# Patient Record
Sex: Female | Born: 1955 | Race: White | Hispanic: No | Marital: Married | Smoking: Current every day smoker
Health system: Southern US, Community
[De-identification: ages and names within clinical notes are randomized; demographics above are authoritative.]

## PROBLEM LIST (undated history)

## (undated) DIAGNOSIS — H6690 Otitis media, unspecified, unspecified ear: Secondary | ICD-10-CM

## (undated) DIAGNOSIS — J0100 Acute maxillary sinusitis, unspecified: Secondary | ICD-10-CM

## (undated) DIAGNOSIS — R1310 Dysphagia, unspecified: Secondary | ICD-10-CM

## (undated) DIAGNOSIS — E2839 Other primary ovarian failure: Secondary | ICD-10-CM

## (undated) DIAGNOSIS — E559 Vitamin D deficiency, unspecified: Secondary | ICD-10-CM

## (undated) DIAGNOSIS — H9209 Otalgia, unspecified ear: Secondary | ICD-10-CM

## (undated) DIAGNOSIS — M549 Dorsalgia, unspecified: Secondary | ICD-10-CM

## (undated) DIAGNOSIS — R300 Dysuria: Secondary | ICD-10-CM

## (undated) DIAGNOSIS — M25512 Pain in left shoulder: Secondary | ICD-10-CM

## (undated) DIAGNOSIS — F419 Anxiety disorder, unspecified: Secondary | ICD-10-CM

## (undated) DIAGNOSIS — R413 Other amnesia: Secondary | ICD-10-CM

## (undated) DIAGNOSIS — M7731 Calcaneal spur, right foot: Secondary | ICD-10-CM

## (undated) DIAGNOSIS — E7800 Pure hypercholesterolemia, unspecified: Secondary | ICD-10-CM

## (undated) DIAGNOSIS — J40 Bronchitis, not specified as acute or chronic: Secondary | ICD-10-CM

## (undated) HISTORY — DX: Anxiety disorder, unspecified: F41.9

## (undated) HISTORY — DX: Dysphagia, unspecified: R13.10

## (undated) HISTORY — DX: Calcaneal spur, right foot: M77.31

## (undated) HISTORY — DX: Bronchitis, not specified as acute or chronic: J40

## (undated) HISTORY — DX: Otitis media, unspecified, unspecified ear: H66.90

## (undated) HISTORY — DX: Otalgia, unspecified ear: H92.09

## (undated) HISTORY — DX: Acute maxillary sinusitis, unspecified: J01.00

## (undated) HISTORY — DX: Other primary ovarian failure: E28.39

## (undated) HISTORY — DX: Vitamin D deficiency, unspecified: E55.9

## (undated) HISTORY — DX: Dysuria: R30.0

## (undated) HISTORY — DX: Other amnesia: R41.3

## (undated) HISTORY — DX: Pure hypercholesterolemia, unspecified: E78.00

## (undated) HISTORY — PX: APPENDECTOMY: SHX54

## (undated) HISTORY — DX: Dorsalgia, unspecified: M54.9

## (undated) HISTORY — DX: Pain in left shoulder: M25.512

---

## 2019-07-15 ENCOUNTER — Ambulatory Visit: Payer: BC Managed Care – PPO | Admitting: Neurology

## 2019-07-15 ENCOUNTER — Other Ambulatory Visit: Payer: Self-pay

## 2019-07-15 ENCOUNTER — Encounter: Payer: Self-pay | Admitting: Neurology

## 2019-07-15 VITALS — BP 119/79 | HR 83 | Temp 98.7°F | Ht 66.0 in | Wt 106.0 lb

## 2019-07-15 DIAGNOSIS — R413 Other amnesia: Secondary | ICD-10-CM

## 2019-07-15 DIAGNOSIS — E538 Deficiency of other specified B group vitamins: Secondary | ICD-10-CM | POA: Diagnosis not present

## 2019-07-15 DIAGNOSIS — R9089 Other abnormal findings on diagnostic imaging of central nervous system: Secondary | ICD-10-CM | POA: Diagnosis not present

## 2019-07-15 NOTE — Progress Notes (Signed)
Reason for visit: Weight loss, abnormal MRI brain  Referring physician: Dr. Dorna Klein is a 63 y.o. female  History of present illness:  Sabrina Klein is a 63 year old right-handed white female with a history of relatively sudden onset of problems with weight loss and some cognitive changes.  The symptoms began in March 2020, the patient has lost from 158 pounds to 106, she has had a decline in her appetite, she gets full with eating small bites.  The patient has had some occasional headaches that have also developed but these are minor headaches and occur about once a week.  Prior to 3 months ago she did not have headaches.  The headaches are bifrontal in nature, unassociated with nausea vomiting or photophobia or phonophobia.  The headaches may last about 2 hours and seem to improve with Tylenol.  She has had severe fatigue with the onset of the weight loss, she is sleeping fairly well but feels "weak all over".  She has had occasional episodes of left leg numbness.  She has had a change in her balance.  She has had 1 fall 2 weeks ago.  She denies issues controlling the bowels or the bladder.  She has had some change in memory, her short-term memory has changed, she has had difficulty keeping up with dates, she is having difficulty with directions.  She has had some problems keeping up with medications and appointments and with doing the finances.  She has developed a flat affect, it was felt that she had depression and she was placed on antidepressant medications without benefit.  She has not had any blackout episodes or seizure type events.  MRI of the brain was done and showed bilateral mesial temporal hyperintensities that by contrast study shows mild enhancement.  The possibility of limbic encephalitis was entertained.  The patient is a smoker, she smokes a pack of cigarettes daily, she has not had a recent chest x-ray or CT of the chest.  CT of the abdomen and pelvis showed a  possible mass or polyp in the right colon.  This will be evaluated in the near future.  No past medical history on file.  Past Surgical History:  Procedure Laterality Date   APPENDECTOMY      Family History  Problem Relation Age of Onset   High blood pressure Mother    Diabetes Mother    Stroke Mother     Social history:  reports that she has been smoking cigarettes. She started smoking about 80 years ago. She has never used smokeless tobacco. She reports that she does not drink alcohol or use drugs.  Medications:  Prior to Admission medications   Medication Sig Start Date End Date Taking? Authorizing Provider  clonazePAM (KLONOPIN) 0.5 MG tablet Take 0.5 mg by mouth daily as needed. 06/27/19  Yes [provider]  potassium citrate (UROCIT-K) 10 MEQ (1080 MG) SR tablet TAKE 1 TABLET BY MOUTH EVERY DAY FOR 14 DAYS 07/08/19  Yes [provider]  VIIBRYD 20 MG TABS Take 1 tablet by mouth daily. 06/26/19  Yes [provider]     No Known Allergies  ROS:  Out of a complete 14 system review of symptoms, the patient complains only of the following symptoms, and all other reviewed systems are negative.  Memory decline, fatigue Weight loss  Blood pressure 119/79, pulse 83, temperature 98.7 F (37.1 C), temperature source Temporal, height 5\' 6"  (1.676 m), weight 106 lb (48.1 kg).  Physical  Exam  General: The patient is alert and cooperative at the time of the examination.  The patient has a flat affect.  Eyes: Pupils are equal, round, and reactive to light. Discs are flat bilaterally.  Neck: The neck is supple, no carotid bruits are noted.  Respiratory: The respiratory examination is clear.  Cardiovascular: The cardiovascular examination reveals a regular rate and rhythm, no obvious murmurs or rubs are noted.  Skin: Extremities are without significant edema.  Neurologic Exam  Mental status: The patient is alert and oriented x 3 at the time of  the examination. The patient has a flat affect, she is able to answer questions appropriately.  Mini-Mental status examination done today shows a total score 28/30.  Cranial nerves: Facial symmetry is not present.  Mild ptosis on the left is noted.  There is good sensation of the face to pinprick and soft touch bilaterally. The strength of the facial muscles and the muscles to head turning and shoulder shrug are normal bilaterally. Speech is well enunciated, no aphasia or dysarthria is noted. Extraocular movements are full. Visual fields are full. The tongue is midline, and the patient has symmetric elevation of the soft palate. No obvious hearing deficits are noted.  Motor: The motor testing reveals 5 over 5 strength of all 4 extremities. Good symmetric motor tone is noted throughout.  Sensory: Sensory testing is intact to pinprick, soft touch, vibration sensation, and position sense on all 4 extremities. No evidence of extinction is noted.  Coordination: Cerebellar testing reveals good finger-nose-finger and heel-to-shin bilaterally.  Gait and station: Gait is normal. Tandem gait is normal. Romberg is negative. No drift is seen.  Reflexes: Deep tendon reflexes are symmetric and normal bilaterally. Toes are downgoing bilaterally.   Assessment/Plan:  1.  Weight loss  2.  Cognitive changes, memory disorder  3.  Abnormal MRI brain  The patient has bilateral mesial temporal abnormalities with mild enhancement.  The possibility of a limbic encephalitis does need to be considered.  The patient will be sent for blood work today.  We will pursue lumbar puncture.  I believe that a CT scan of the chest should be done to exclude lung cancer as the patient is a smoker.  The patient will follow-up here in 6 weeks.  Jill Alexanders MD 07/15/2019 1:53 PM  [facility] [address] [place], [place] [address]  Phone [phone] Fax [phone]

## 2019-07-17 ENCOUNTER — Telehealth: Payer: Self-pay | Admitting: *Deleted

## 2019-07-17 NOTE — Telephone Encounter (Signed)
Lab results from [facility] placed on Dr Jannifer Franklin' desk for review.

## 2019-07-17 NOTE — Telephone Encounter (Signed)
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

## 2019-07-18 ENCOUNTER — Telehealth: Payer: Self-pay | Admitting: Neurology

## 2019-07-18 DIAGNOSIS — R634 Abnormal weight loss: Secondary | ICD-10-CM

## 2019-07-18 DIAGNOSIS — R05 Cough: Secondary | ICD-10-CM

## 2019-07-18 DIAGNOSIS — Z87891 Personal history of nicotine dependence: Secondary | ICD-10-CM

## 2019-07-18 DIAGNOSIS — R059 Cough, unspecified: Secondary | ICD-10-CM

## 2019-07-18 NOTE — Addendum Note (Signed)
Addended by: Kathrynn Ducking on: 07/18/2019 09:28 AM   Modules accepted: Orders

## 2019-07-18 NOTE — Telephone Encounter (Signed)
I have called and talk with the daughter.  The patient began having diarrhea several days ago, she is getting weak from this, no fevers noted.  They are unable to contact the primary doctor, if they cannot do this, she may have to go to urgent care.  I will go ahead and order the CT of the chest, her BUN/creatinine were normal.

## 2019-07-18 NOTE — Telephone Encounter (Signed)
Pt daughter in law(on [place]) has called for Dr Jannifer Franklin to be aware that they have not been contacted about the CT or [employer].  Daughter in law is asking for a call from Dr Jannifer Franklin to discuss pt having diarrhea and extreme weakness.  Please call

## 2019-07-22 ENCOUNTER — Telehealth: Payer: Self-pay | Admitting: Neurology

## 2019-07-22 NOTE — Telephone Encounter (Signed)
[employer] Auth: NPR via bcbs website order sent to GI. They will reach out to the patient to schedule.

## 2019-07-23 DIAGNOSIS — R634 Abnormal weight loss: Secondary | ICD-10-CM | POA: Insufficient documentation

## 2019-07-23 NOTE — Telephone Encounter (Signed)
I called the daughter.  The blood work has come back normal, 1 antibody test is still pending.  What I would like to do is start her on Solu-Medrol, 1 g daily for 3 days and then once a week for 6 weeks.  This is a treatment/diagnostic procedure for autoimmune dementias.  They appear to be amenable to this.  The patient will be getting her CT of the chest soon, she will have her lumbar puncture done.  She will undergo a colonoscopy in the near future.

## 2019-07-23 NOTE — Telephone Encounter (Signed)
Pt has called asking for the results to blood work, she is asking for a call from Dr Jannifer Franklin.  Pt wants Dr Jannifer Franklin to call her daughter in law Misty [phone]

## 2019-07-24 LAB — ANGIOTENSIN CONVERTING ENZYME: Angio Convert Enzyme: 44 U/L (ref 14–82)

## 2019-07-24 LAB — AMMONIA: Ammonia: 46 ug/dL (ref 34–178)

## 2019-07-24 LAB — PAN-ANCA
ANCA Proteinase 3: <3.5 U/mL (ref 0.0–3.5)
Atypical pANCA: <1:20 {titer}
C-ANCA: <1:20 {titer}
Myeloperoxidase Ab: <9 U/mL (ref 0.0–9.0)
P-ANCA: <1:20 {titer}

## 2019-07-24 LAB — VITAMIN B12: Vitamin B-12: 264 pg/mL (ref 232–1245)

## 2019-07-24 LAB — ANA W/REFLEX: Anti Nuclear Antibody (ANA): NEGATIVE

## 2019-07-24 LAB — PARANEOPLASTIC PROFILE 1
Neuronal Nuc Ab (Ri), IFA: <1:10 {titer}
Neuronal Nuclear (Hu) Antibody (IB): <1:10 {titer}
Purkinje Cell (Yo) Autoantobodies- IFA: <1:10 {titer}

## 2019-07-24 LAB — B. BURGDORFI ANTIBODIES: Lyme IgG/IgM Ab: <0.91 {ISR} (ref 0.00–0.90)

## 2019-07-24 LAB — THYROGLOBULIN ANTIBODY: Thyroglobulin Antibody: <1 IU/mL (ref 0.0–0.9)

## 2019-07-24 LAB — N-METHYL-D-ASPARTATE RECPT.IGG: N-methyl-D-Aspartate Recpt.IgG: <1:10 {titer}

## 2019-07-24 LAB — THYROID PEROXIDASE ANTIBODY: Thyroperoxidase Ab SerPl-aCnc: 23 IU/mL (ref 0–34)

## 2019-07-24 LAB — SEDIMENTATION RATE: Sed Rate: 21 mm/hr (ref 0–40)

## 2019-07-28 ENCOUNTER — Other Ambulatory Visit: Payer: Self-pay

## 2019-07-28 ENCOUNTER — Ambulatory Visit
Admission: RE | Admit: 2019-07-28 | Discharge: 2019-07-28 | Disposition: A | Discharge status: Home / Self Care | Payer: BC Managed Care – PPO | Source: Ambulatory Visit | Attending: Neurology | Admitting: Neurology

## 2019-07-28 ENCOUNTER — Other Ambulatory Visit (HOSPITAL_COMMUNITY)
Admission: RE | Admit: 2019-07-28 | Discharge: 2019-07-28 | Disposition: A | Discharge status: Home / Self Care | Payer: BC Managed Care – PPO | Source: Ambulatory Visit | Attending: Neurology | Admitting: Neurology

## 2019-07-28 VITALS — BP 108/62 | HR 72

## 2019-07-28 DIAGNOSIS — R9089 Other abnormal findings on diagnostic imaging of central nervous system: Secondary | ICD-10-CM | POA: Diagnosis not present

## 2019-07-28 DIAGNOSIS — R413 Other amnesia: Secondary | ICD-10-CM | POA: Diagnosis present

## 2019-07-28 NOTE — Progress Notes (Signed)
One SST tube of blood drawn for LP labs from left [facility] space; site unremarkable.

## 2019-07-28 NOTE — Discharge Instructions (Signed)

## 2019-07-30 ENCOUNTER — Telehealth: Payer: Self-pay | Admitting: *Deleted

## 2019-07-30 ENCOUNTER — Ambulatory Visit
Admission: RE | Admit: 2019-07-30 | Discharge: 2019-07-30 | Disposition: A | Discharge status: Home / Self Care | Payer: BC Managed Care – PPO | Source: Ambulatory Visit | Attending: Neurology | Admitting: Neurology

## 2019-07-30 ENCOUNTER — Telehealth: Payer: Self-pay | Admitting: Neurology

## 2019-07-30 DIAGNOSIS — R933 Abnormal findings on diagnostic imaging of other parts of digestive tract: Secondary | ICD-10-CM

## 2019-07-30 DIAGNOSIS — R634 Abnormal weight loss: Secondary | ICD-10-CM

## 2019-07-30 DIAGNOSIS — R059 Cough, unspecified: Secondary | ICD-10-CM

## 2019-07-30 DIAGNOSIS — R9389 Abnormal findings on diagnostic imaging of other specified body structures: Secondary | ICD-10-CM

## 2019-07-30 DIAGNOSIS — Z87891 Personal history of nicotine dependence: Secondary | ICD-10-CM

## 2019-07-30 DIAGNOSIS — R05 Cough: Secondary | ICD-10-CM

## 2019-07-30 MED ORDER — IOPAMIDOL (ISOVUE-300) INJECTION 61%
75.0000 mL | Freq: Once | INTRAVENOUS | Status: AC | PRN
  Administered 2019-07-30: 75 mL via INTRAVENOUS

## 2019-07-30 NOTE — Telephone Encounter (Signed)
I called Misty back about pts solu medrol schedule at [facility]. I stated pts infusions have already been authorized at [facility] for 3 days next week. I stated if we try to get it change to [facility] it will delay her infusions. I stated we do not want any delays with pts infusions. I recommend to [facility] that pt receive her infusions for the days listed and we can work on getting future infusions at [facility].Misty  verbalized understanding.

## 2019-07-30 NOTE — Telephone Encounter (Signed)
I called Misty on patients dpr about her infusions schedule at [facility].for solu-medrol. She stated pt is schedule for 8/24, 8/25 and 8/26. She stated its a half and hour drive and [facility] is closer. I stated she will receive a call back.

## 2019-07-30 NOTE — Addendum Note (Signed)
Addended by: Marcial Pacas on: 07/30/2019 04:47 PM   Modules accepted: Orders

## 2019-07-30 NOTE — Telephone Encounter (Signed)
Daughter in [place] is asking for a call from RN to discuss pt getting her infusions at [facility], that location is closer for pt.  Please call

## 2019-07-30 NOTE — Telephone Encounter (Signed)
I spoke to her daughter, Stefany Starace (on [place]).  She is aware of the results for the LP and verbalized understanding.

## 2019-07-30 NOTE — Telephone Encounter (Signed)
-----   Message from Penni Bombard, MD sent at 07/29/2019  4:48 PM EDT ----- Normal LP opening pressure. -VRP

## 2019-07-30 NOTE — Telephone Encounter (Addendum)
I have called patient's daughter in law, [place], CT chest showed abnormally enlarged lymph node 3.0 x 1.8 cm, suspicious for malignancy.  I have ordered a PET/CT,  She already has oncologist appt with Dr. Inocente Salles on Monday August 24th, 2020, colonoscopy on August 01, 2019  Will cancel previously scheduled IV steroid infusion  IMPRESSION: 1. There is an abnormally enlarged 3.0 x 1.8 cm AP window lymph node (series 2, image 76, series 4, image 67). There are additional prominent although not pathologically enlarged mediastinal and hilar lymph nodes. Although there is no obvious primary lung mass this finding remains concerning for malignancy. Consider PET-CT to evaluate for abnormal FDG avidity. Comparison to prior CT imaging of the chest, if available, could be helpful to assess for stability.  2.  Emphysema.  3. Occasional tiny pulmonary nodules measuring 3 mm or smaller, nonspecific. Attention on follow-up.  4. Incidental note of a heterogeneous 1.5 cm nodule of the right lobe of the thyroid. Consider dedicated thyroid ultrasound examination to further characterize when clinically appropriate.  5.  Coronary artery disease and aortic atherosclerosis.

## 2019-07-30 NOTE — Telephone Encounter (Signed)
R/c cd from [facility]. Pt cd on Megan.

## 2019-08-04 ENCOUNTER — Telehealth: Payer: Self-pay | Admitting: Neurology

## 2019-08-04 NOTE — Telephone Encounter (Signed)
CT of the chest was done last week when I was not in the office.  This shows findings that are suspicious for malignancy.  The patient apparently has already been referred to oncology.  The steroid injections that were recommended initially have been canceled.  So far, spinal fluid analysis is normal.  Oligoclonal banding is pending, spinal fluid cytology was negative.  A PET scan has been ordered.  I did call the patient, Dr. Krista Blue had already discussed the results with the patient, the patient has an appointment to see oncology today.  I left a message for the patient to call me if she needs anything.   CT chest 07/30/19:  IMPRESSION: 1. There is an abnormally enlarged 3.0 x 1.8 cm AP window lymph node (series 2, image 76, series 4, image 67). There are additional prominent although not pathologically enlarged mediastinal and hilar lymph nodes. Although there is no obvious primary lung mass this finding remains concerning for malignancy. Consider PET-CT to evaluate for abnormal FDG avidity. Comparison to prior CT imaging of the chest, if available, could be helpful to assess for stability.  2.  Emphysema.  3. Occasional tiny pulmonary nodules measuring 3 mm or smaller, nonspecific. Attention on follow-up.  4. Incidental note of a heterogeneous 1.5 cm nodule of the right lobe of the thyroid. Consider dedicated thyroid ultrasound examination to further characterize when clinically appropriate.  5.  Coronary artery disease and aortic atherosclerosis.

## 2019-08-05 ENCOUNTER — Telehealth: Payer: Self-pay | Admitting: Neurology

## 2019-08-05 NOTE — Telephone Encounter (Signed)
Events noted, the patient will certainly need to have a full cancer work-up.

## 2019-08-05 NOTE — Telephone Encounter (Signed)
Pt's daughter in [place], [place] called wanting to inform the provider that the pt saw the oncologist yesterday and they are going to set the pt up for a consultation at [facility] for a bronchoscopy to test for perineum plastic syndrome. They would like to thank the provider for all of the assistance that was provided.

## 2019-08-06 ENCOUNTER — Telehealth: Payer: Self-pay | Admitting: Neurology

## 2019-08-06 LAB — OLIGOCLONAL BANDS, CSF + SERM

## 2019-08-06 LAB — CSF CELL COUNT WITH DIFFERENTIAL
RBC Count, CSF: 9 cells/uL (ref 0–10)
WBC, CSF: 0 cells/uL (ref 0–5)

## 2019-08-06 LAB — ANGIOTENSIN CONVERTING ENZYME, CSF: ACE, CSF: 10 U/L (ref <=15)

## 2019-08-06 LAB — PROTEIN, CSF: Total Protein, CSF: 54 mg/dL (ref 15–60)

## 2019-08-06 LAB — GLUCOSE, CSF: Glucose, CSF: 56 mg/dL (ref 40–80)

## 2019-08-06 NOTE — Telephone Encounter (Signed)
I called the patient.  The spinal fluid results are unremarkable.  Cytology is negative.  There are 5 oligoclonal bands but those bands are also present in the peripheral blood, study does not support the presence of an autoimmune dementia.

## 2019-08-11 ENCOUNTER — Encounter (HOSPITAL_COMMUNITY)
Admission: RE | Admit: 2019-08-11 | Discharge: 2019-08-11 | Disposition: A | Discharge status: Home / Self Care | Payer: BC Managed Care – PPO | Source: Ambulatory Visit | Attending: Neurology | Admitting: Neurology

## 2019-08-11 ENCOUNTER — Telehealth: Payer: Self-pay | Admitting: Neurology

## 2019-08-11 ENCOUNTER — Other Ambulatory Visit: Payer: Self-pay

## 2019-08-11 DIAGNOSIS — R933 Abnormal findings on diagnostic imaging of other parts of digestive tract: Secondary | ICD-10-CM | POA: Diagnosis not present

## 2019-08-11 LAB — GLUCOSE, CAPILLARY: Glucose-Capillary: 88 mg/dL (ref 70–99)

## 2019-08-11 MED ORDER — FLUDEOXYGLUCOSE F - 18 (FDG) INJECTION
5.2000 | Freq: Once | INTRAVENOUS | Status: AC | PRN
  Administered 2019-08-11: 5.2 via INTRAVENOUS

## 2019-08-11 NOTE — Telephone Encounter (Signed)
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  The patient is followed through an oncologist, the patient will be referred to a pulmonologist in the near future at [facility].

## 2019-08-11 NOTE — Telephone Encounter (Signed)
  IMPRESSION: 1. Intense FDG uptake is associated with the left-sided mediastinal lymph node lateral to the left pulmonary artery. Suspicious for primary bronchogenic carcinoma. 2. FDG avid right lobe of thyroid gland nodule measures 1.6 cm. Hypermetabolic thyroid nodules on PET have up to 40-50% incidence of malignancy; recommend further evaluation with thyroid ultrasound and possible US-guided fine needle aspiration. 3. Aortic Atherosclerosis (ICD10-I70.0) and Emphysema (ICD10-J43.9). 4. Coronary artery calcifications.

## 2019-08-14 ENCOUNTER — Telehealth: Payer: Self-pay

## 2019-08-14 NOTE — Telephone Encounter (Signed)
Short term disability for has been completed and signed by Dr. Jannifer Franklin. Form fwd back to medical records for processing.

## 2019-08-28 ENCOUNTER — Ambulatory Visit: Payer: BC Managed Care – PPO | Admitting: Neurology

## 2019-09-26 MED ORDER — OLANZAPINE 10 MG PO TABS
10.00 | ORAL_TABLET | ORAL | Status: DC
Start: ? — End: 2019-09-26

## 2019-09-26 MED ORDER — NALOXONE HCL 0.4 MG/ML IJ SOLN
0.40 | INTRAMUSCULAR | Status: DC
Start: ? — End: 2019-09-26

## 2019-09-26 MED ORDER — OXYCODONE HCL 5 MG PO TABS
5.00 | ORAL_TABLET | ORAL | Status: DC
Start: ? — End: 2019-09-26

## 2019-09-26 MED ORDER — POLYETHYLENE GLYCOL 3350 17 G PO PACK
17.00 | PACK | ORAL | Status: DC
Start: 2019-09-27 — End: 2019-09-26

## 2019-09-26 MED ORDER — NICOTINE 21 MG/24HR TD PT24
1.00 | MEDICATED_PATCH | TRANSDERMAL | Status: DC
Start: 2019-09-27 — End: 2019-09-26

## 2019-09-26 MED ORDER — HEPARIN SODIUM (PORCINE) 5000 UNIT/ML IJ SOLN
5000.00 | INTRAMUSCULAR | Status: DC
Start: 2019-09-26 — End: 2019-09-26

## 2019-09-26 MED ORDER — GENERIC EXTERNAL MEDICATION
1.00 | Status: DC
Start: ? — End: 2019-09-26

## 2019-09-26 MED ORDER — NICOTINE POLACRILEX 4 MG MT LOZG
4.00 | LOZENGE | OROMUCOSAL | Status: DC
Start: ? — End: 2019-09-26

## 2019-09-26 MED ORDER — PNEUMOCOCCAL VAC POLYVALENT 25 MCG/0.5ML IJ INJ
0.50 | INJECTION | INTRAMUSCULAR | Status: DC
Start: ? — End: 2019-09-26

## 2019-09-26 MED ORDER — ONDANSETRON HCL 4 MG/2ML IJ SOLN
4.00 | INTRAMUSCULAR | Status: DC
Start: ? — End: 2019-09-26

## 2019-09-26 MED ORDER — ACETAMINOPHEN 325 MG PO TABS
650.00 | ORAL_TABLET | ORAL | Status: DC
Start: 2019-09-26 — End: 2019-09-26

## 2019-09-26 MED ORDER — INFLUENZA VAC SPLIT QUAD 0.5 ML IM SUSY
0.50 | PREFILLED_SYRINGE | INTRAMUSCULAR | Status: DC
Start: ? — End: 2019-09-26

## 2019-09-26 MED ORDER — DOCUSATE SODIUM 100 MG PO CAPS
100.00 | ORAL_CAPSULE | ORAL | Status: DC
Start: 2019-09-26 — End: 2019-09-26

## 2019-12-17 ENCOUNTER — Telehealth: Payer: Self-pay | Admitting: Neurology

## 2019-12-17 NOTE — Telephone Encounter (Signed)
Called pt and lm on vm that our office will need to collect $50 fee for FMLA forms to be completed

## 2019-12-24 NOTE — Telephone Encounter (Signed)
I called patient again attempting to collect payment for [employer] form. I spoke with patient's daughter who handles patient's affairs and she states that our office does not need to fill the forms out. She states patient's oncologist fills out the paperwork for patient.

## 2020-01-07 ENCOUNTER — Other Ambulatory Visit (HOSPITAL_COMMUNITY): Payer: Self-pay | Admitting: Oncology

## 2020-01-07 DIAGNOSIS — C3490 Malignant neoplasm of unspecified part of unspecified bronchus or lung: Secondary | ICD-10-CM

## 2020-01-12 ENCOUNTER — Encounter (HOSPITAL_COMMUNITY)
Admission: RE | Admit: 2020-01-12 | Discharge: 2020-01-12 | Disposition: A | Discharge status: Home / Self Care | Payer: BC Managed Care – PPO | Source: Ambulatory Visit | Attending: Oncology | Admitting: Oncology

## 2020-01-12 ENCOUNTER — Other Ambulatory Visit: Payer: Self-pay

## 2020-01-12 DIAGNOSIS — C3490 Malignant neoplasm of unspecified part of unspecified bronchus or lung: Secondary | ICD-10-CM | POA: Diagnosis not present

## 2020-01-12 MED ORDER — FLUDEOXYGLUCOSE F - 18 (FDG) INJECTION
6.8600 | Freq: Once | INTRAVENOUS | Status: AC | PRN
  Administered 2020-01-12: 6.86 via INTRAVENOUS

## 2020-01-27 ENCOUNTER — Telehealth: Payer: Self-pay | Admitting: Neurology

## 2020-01-27 NOTE — Telephone Encounter (Signed)
Called pt to collect fee for disability form to be completed and per her Daughter-Inlaw Rojelio Brenner they do not need our office to complete the form pt's oncology doctor is handling all the required forms/bws

## 2020-08-09 IMAGING — PT NM PET TUM IMG RESTAG (PS) SKULL BASE T - THIGH
3 series · 21 of 25 positions shown · non-contrast
Comparison: 08/11/2019

CLINICAL DATA: Subsequent treatment strategy for lung cancer
diagnosed in 8282. Restaging. Radiation therapy finishing 2 weeks
ago. Chemotherapy last week.

EXAM:
NUCLEAR MEDICINE PET SKULL BASE TO THIGH
TECHNIQUE: 6.9 mCi F-18 FDG was injected intravenously. Full-ring PET imaging
was performed from the skull base to thigh after the radiotracer. CT
data was obtained and used for attenuation correction and anatomic
localization.
Fasting blood glucose: 143 mg/dl

[axial ct wb fusion · 14 of 158 slices shown]
[im 1/158]
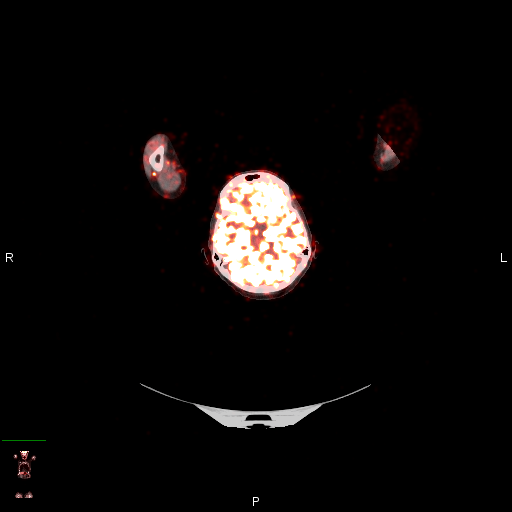
[im 10/158]
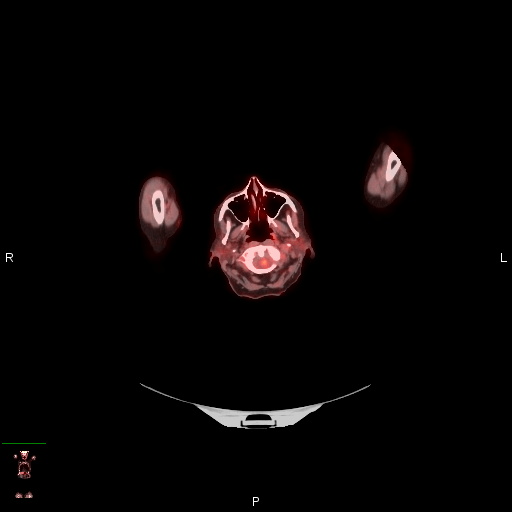
[im 20/158]
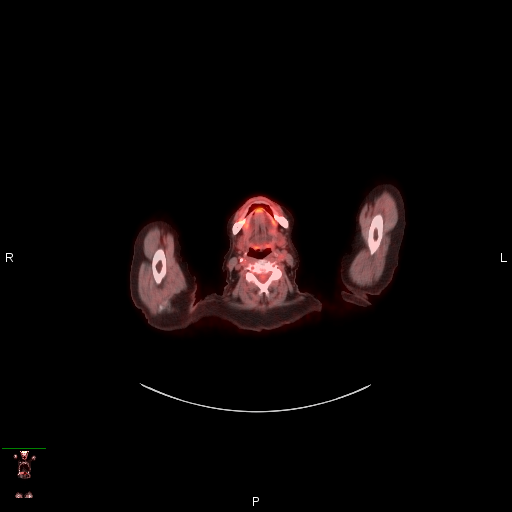
[im 40/158]
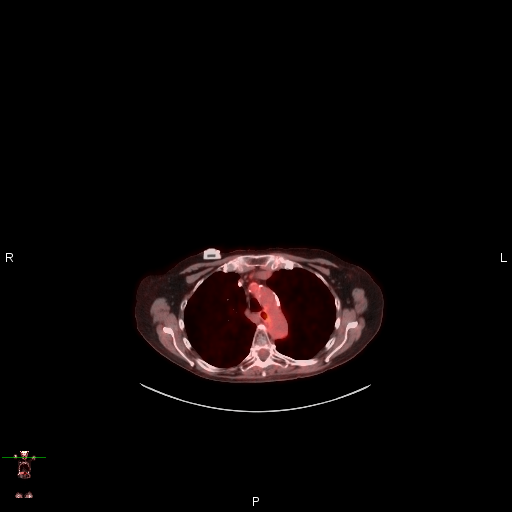
[im 50/158]
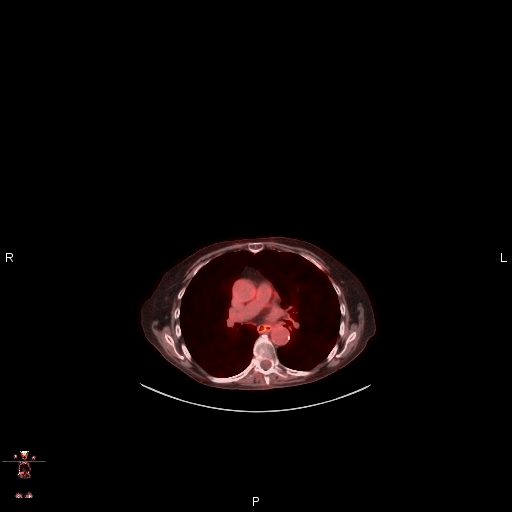
[im 59/158]
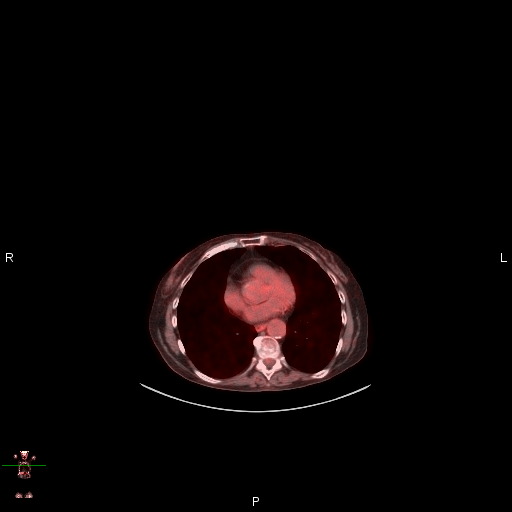
[im 69/158]
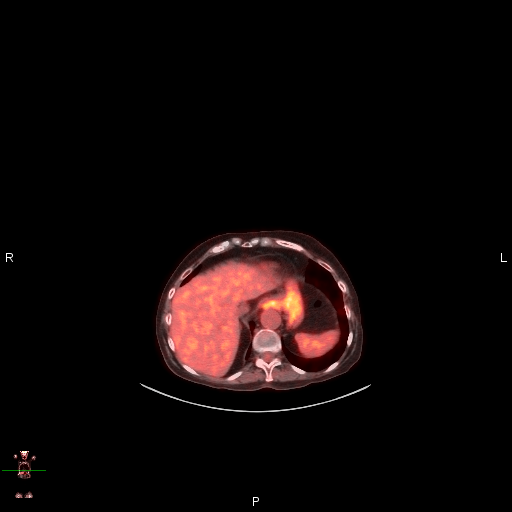
[im 79/158]
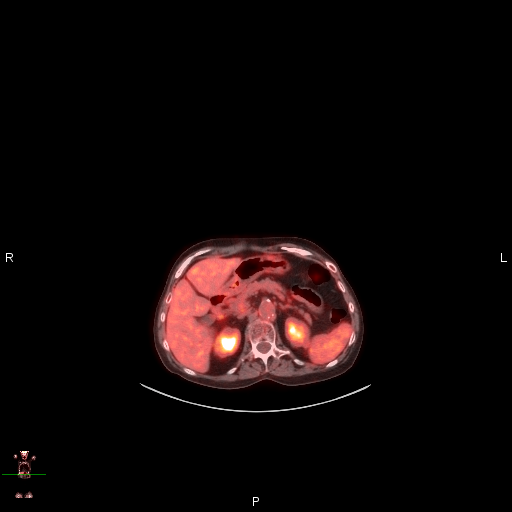
[im 99/158]
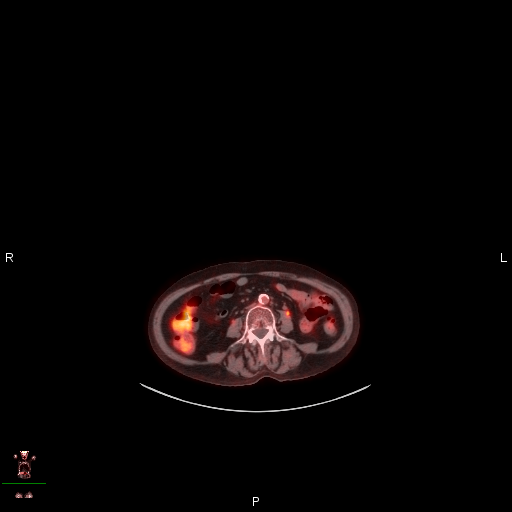
[im 108/158]
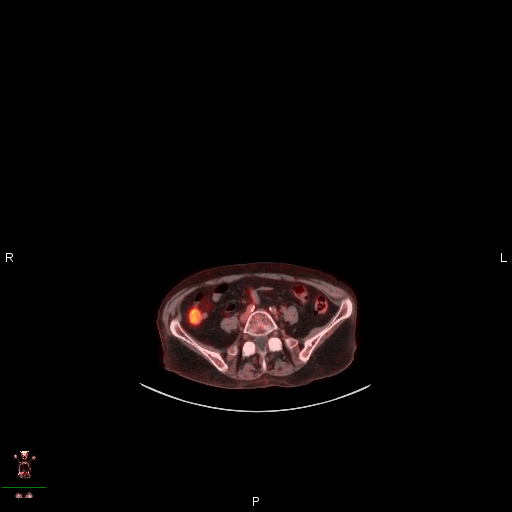
[im 118/158]
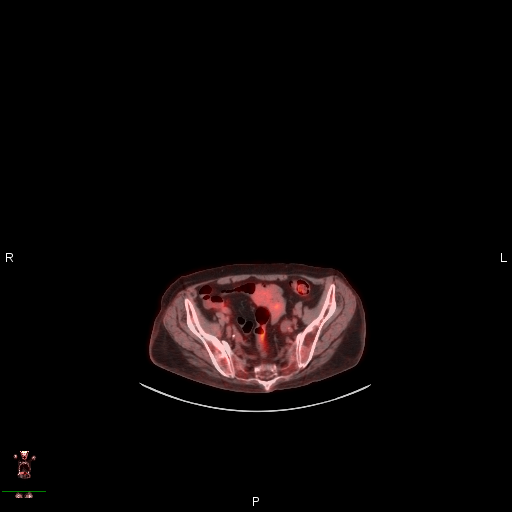
[im 128/158]
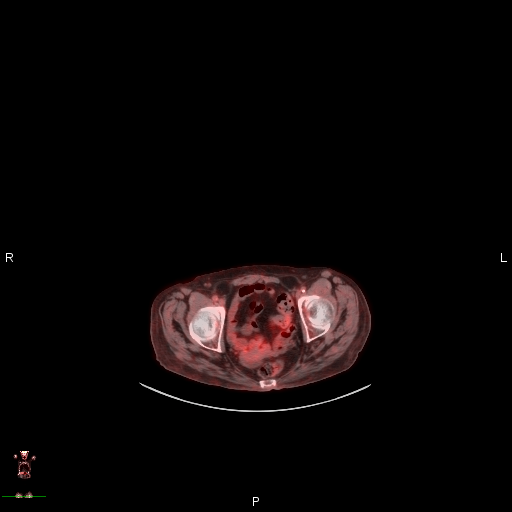
[im 138/158]
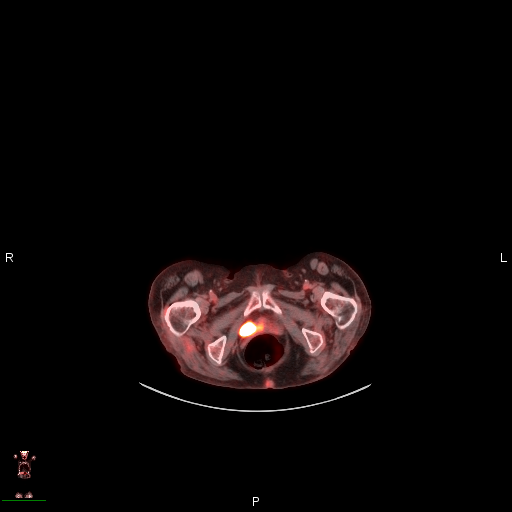
[im 158/158]
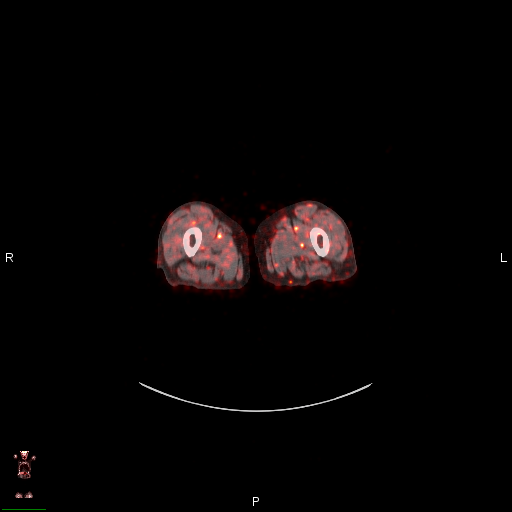

[coronal ct wb fusion · 3 of 27 slices shown]
[im 1/27]
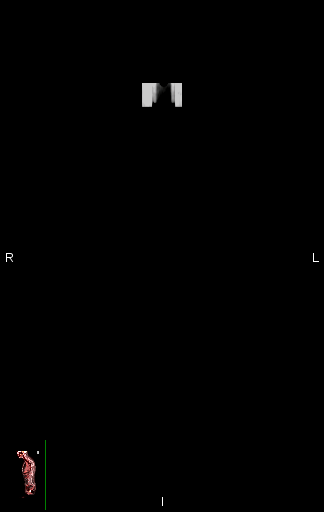
[im 14/27]
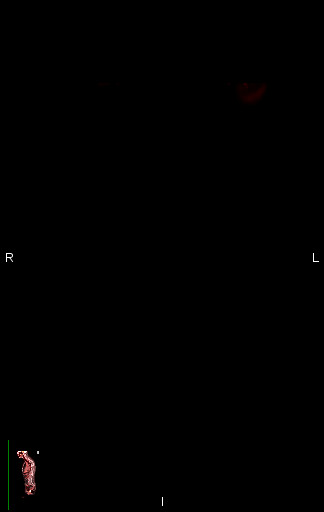
[im 27/27]
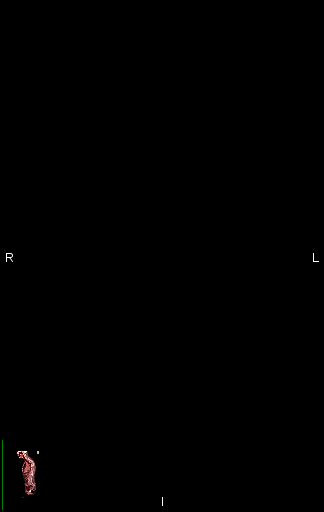

[mip · 4 of 48 slices shown]
[im 1/48]
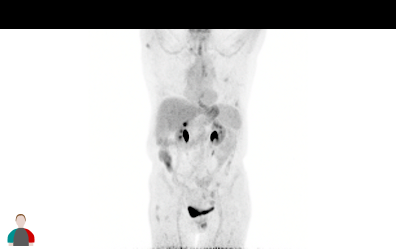
[im 24/48]
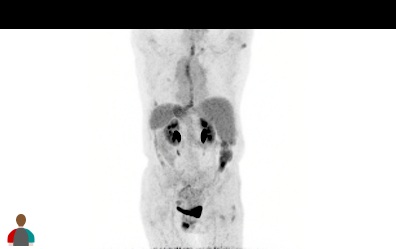
[im 36/48]
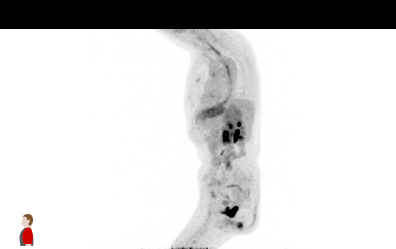
[im 48/48]
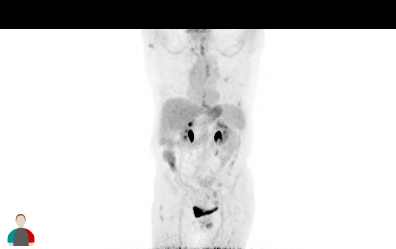

[21 of 25 positions shown; findings below may reference images not displayed]

FINDINGS: Mediastinal blood pool activity: SUV max

Liver activity: SUV max NA

NECK: Left facial muscular activity is likely due to motion after
radiopharmaceutical injection.

Hypermetabolism at the level of the low right neck. This projects
slightly lateral to the right lobe of the thyroid (the site of a
hypermetabolic nodule on the prior PET) and is favored to correspond
to a 6 mm node. This measures a S.U.V. max of 3.7 and is similar in
size to on the prior PET. Example image 62/3 today.

Incidental CT findings: Bilateral carotid atherosclerosis. No
cervical adenopathy.

CHEST: The left anterior mediastinal node is decreased in size and
hypermetabolism. Example 6 mm and a S.U.V. max of 3.6 today on 96/3
versus 1.4 cm and a S.U.V. max of 17.6 on the prior.

Two areas of pleural-based irregularity within or adjacent the left
lower lobe. The more anterior corresponds to hypermetabolism,
including at 8 mm and a S.U.V. max of 2.9 on 133/3.

Incidental CT findings: Right Port-A-Cath tip low SVC. Aortic and
coronary artery atherosclerosis. Mild centrilobular emphysema

ABDOMEN/PELVIS: No abdominopelvic parenchymal or nodal
hypermetabolism.

Incidental CT findings: Normal adrenal glands. Abdominal aortic
atherosclerosis. Hysterectomy. Pelvic floor laxity.

SKELETON: Low-level, nonspecific hypermetabolism identified about
the third anterior left rib. This measures a S.U.V. max of 2.1 on
approximately image 98/3, and is without CT correlate.

Incidental CT findings: Mild osteopenia.
IMPRESSION: 1. Response to therapy of the hypermetabolic adenopathy within the
anterior mediastinum.
2. 2 new foci of pleural-based nodularity within or adjacent the
left lower lobe. The more anterior demonstrates hypermetabolism.
Pleural-based metastasis cannot be excluded. Consider CT follow-up
at 3 months.
3. Hypermetabolism at the low right neck, felt to be lateral to the
thyroid gland and favored to be within the stable, small low jugular
node, indeterminate.
4. No findings of subdiaphragmatic metastatic disease.
5. Low-level, nonspecific hypermetabolism about the anterior left
third rib.
6. Age advanced coronary artery atherosclerosis. Recommend
assessment of coronary risk factors and consideration of medical
therapy.
7. Aortic atherosclerosis (OKAJP-AY8.8) and emphysema ([id number].Z).

## 2021-09-10 DEATH — deceased
